# Patient Record
Sex: Female | Born: 1940 | Race: Black or African American | Hispanic: No | Marital: Single | Smoking: Never smoker
Health system: Southern US, Community
[De-identification: ages and names within clinical notes are randomized; demographics above are authoritative.]

## PROBLEM LIST (undated history)

## (undated) DIAGNOSIS — M1990 Unspecified osteoarthritis, unspecified site: Secondary | ICD-10-CM

## (undated) DIAGNOSIS — E7800 Pure hypercholesterolemia, unspecified: Secondary | ICD-10-CM

---

## 2003-03-27 ENCOUNTER — Encounter: Payer: Self-pay | Admitting: Internal Medicine

## 2003-03-27 ENCOUNTER — Encounter: Admission: RE | Admit: 2003-03-27 | Discharge: 2003-03-27 | Payer: Self-pay | Admitting: Internal Medicine

## 2003-11-21 ENCOUNTER — Encounter: Admission: RE | Admit: 2003-11-21 | Discharge: 2003-11-21 | Payer: Self-pay | Admitting: Internal Medicine

## 2004-04-08 ENCOUNTER — Encounter: Admission: RE | Admit: 2004-04-08 | Discharge: 2004-04-08 | Payer: Self-pay | Admitting: Internal Medicine

## 2005-03-14 IMAGING — CR DG WRIST COMPLETE 3+V*L*
3 series · 3 of 3 positions shown · non-contrast
Comparison: none

CLINICAL DATA: Left wrist pain, no injury.
 LEFT WRIST
 Three views of the left wrist were obtained.  No acute fracture is seen. The bones are osteopenic.  The carpal bones are in normal position.
 IMPRESSION
 No acute abnormality.  Osteopenia.

[view not recorded (1 of 3)]
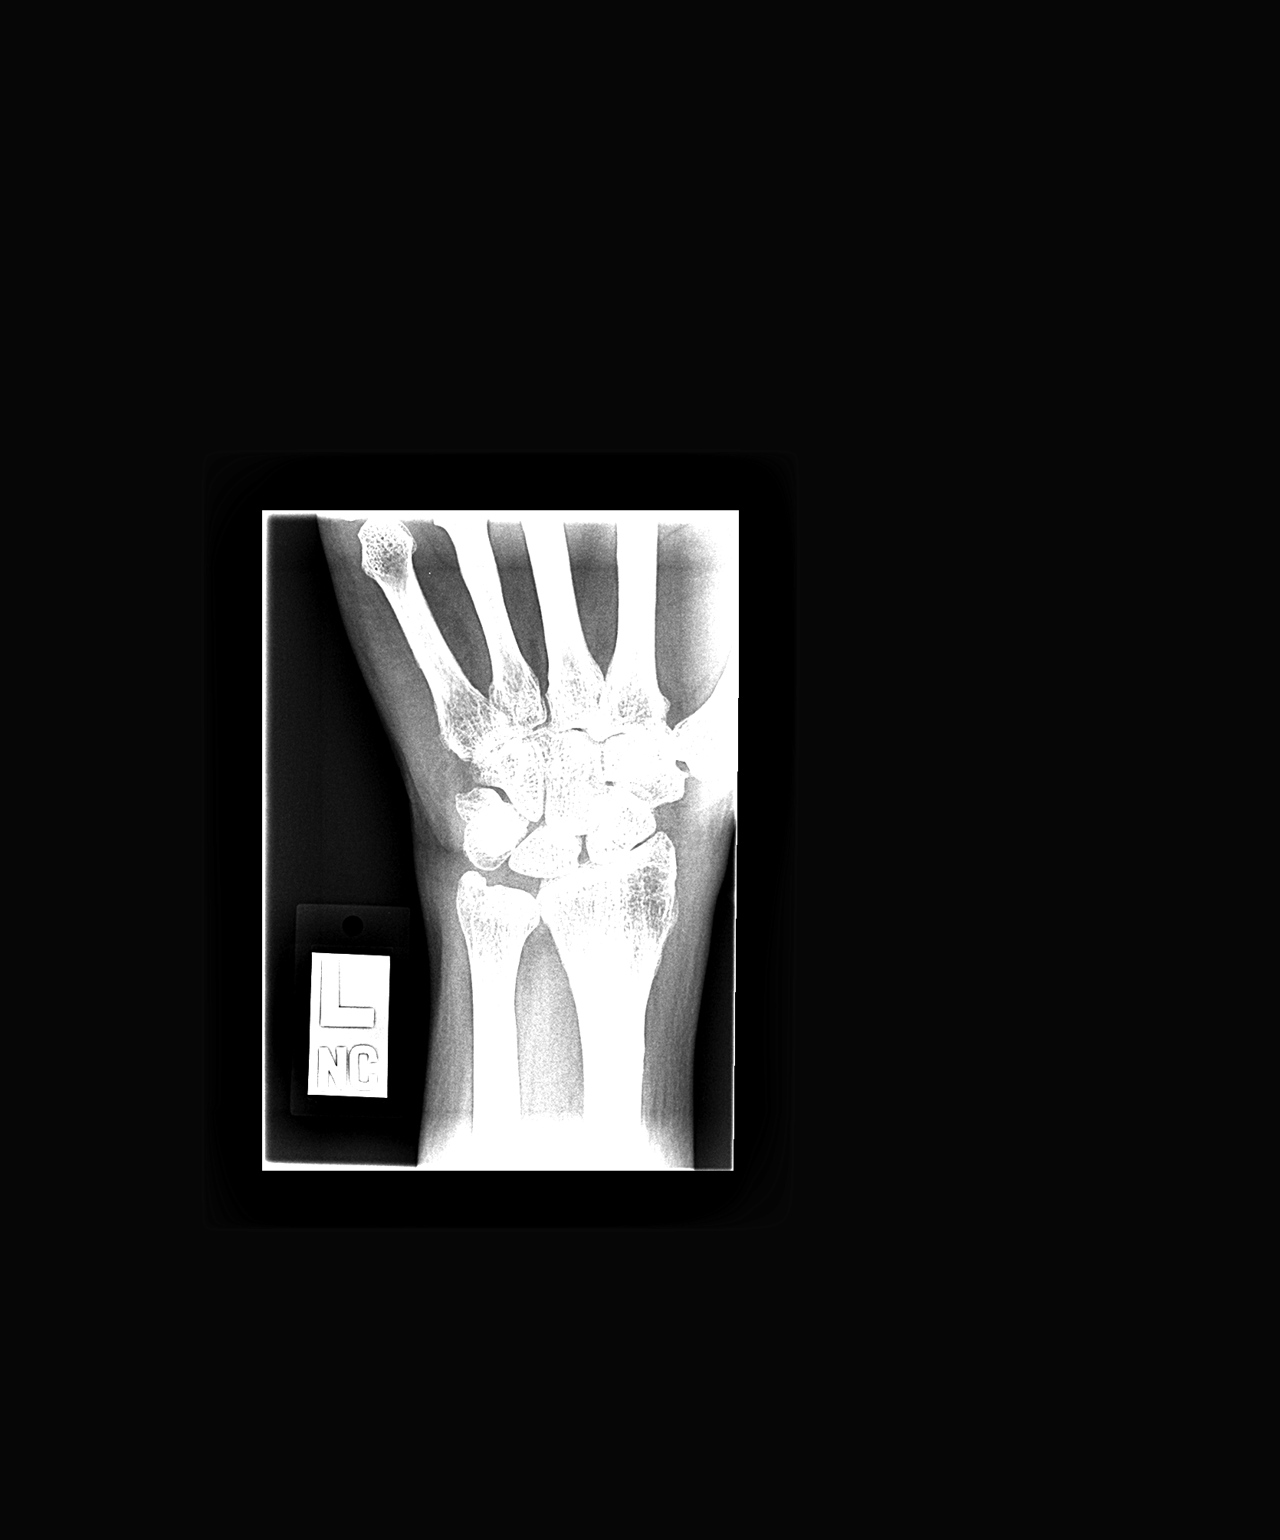

[view not recorded (2 of 3)]
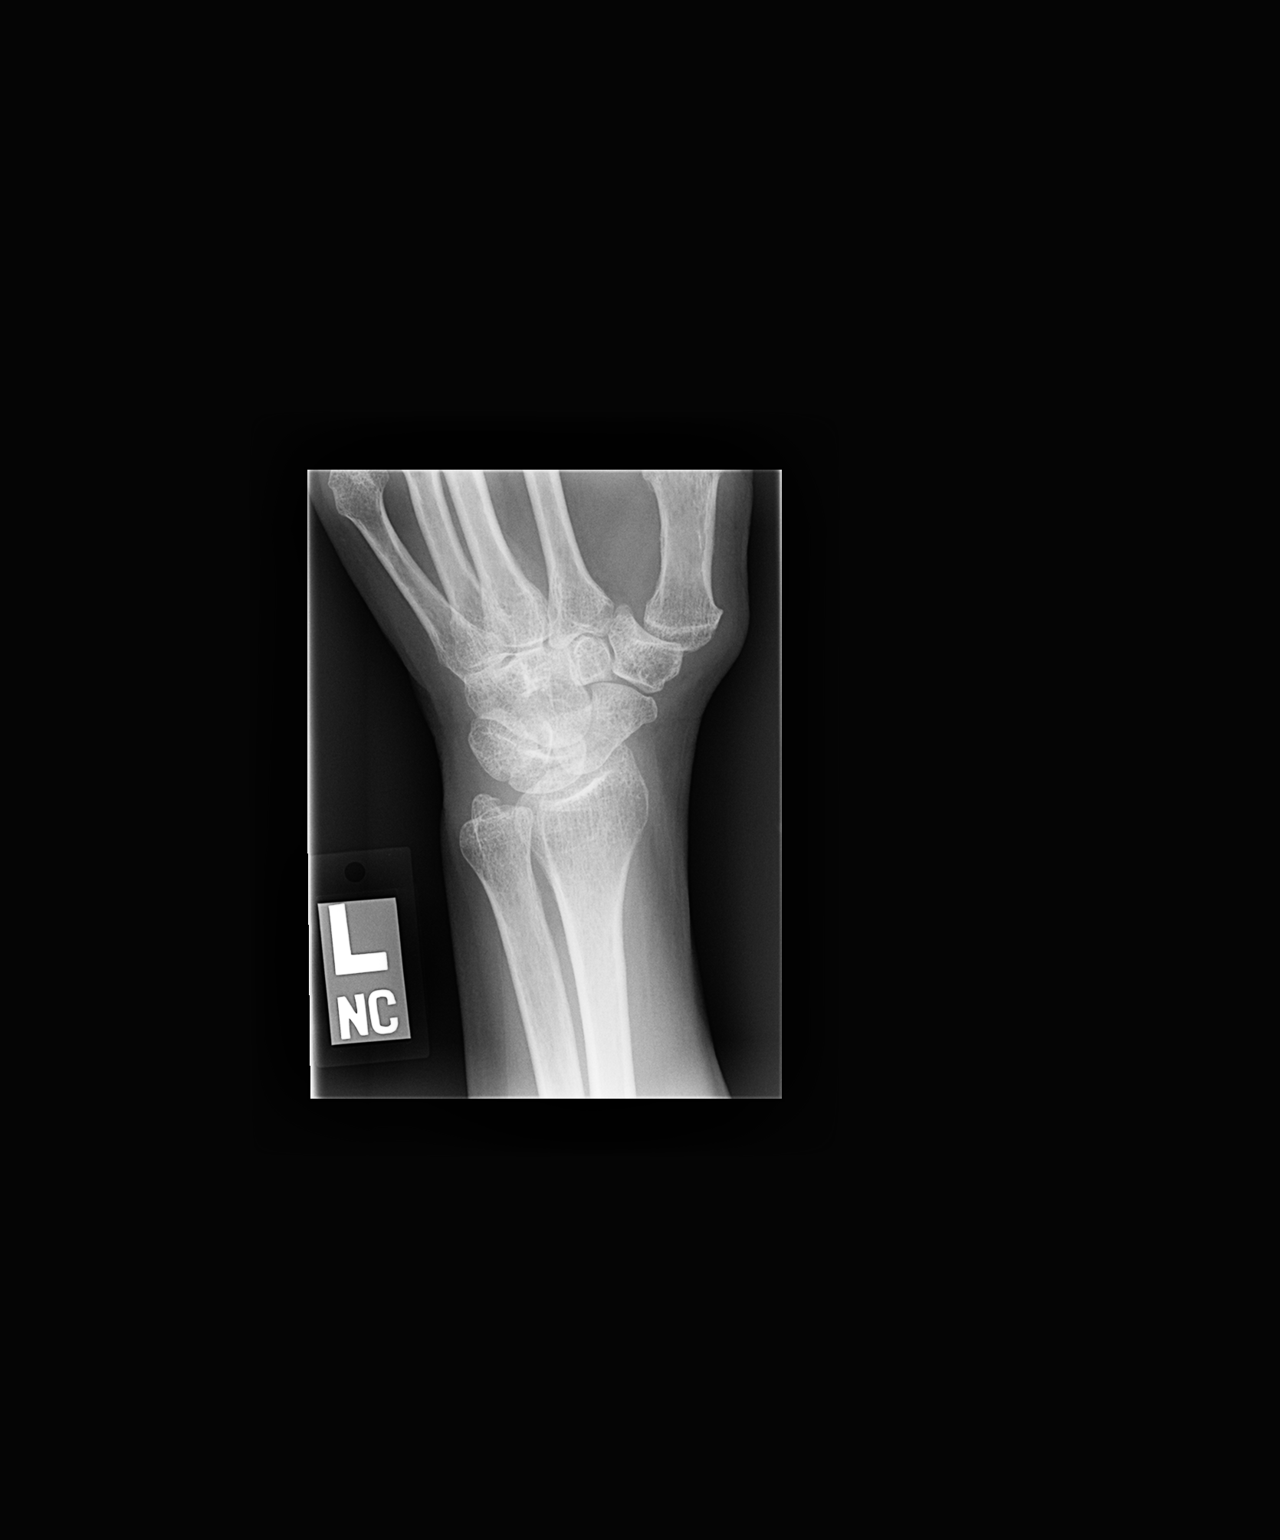

[view not recorded (3 of 3)]
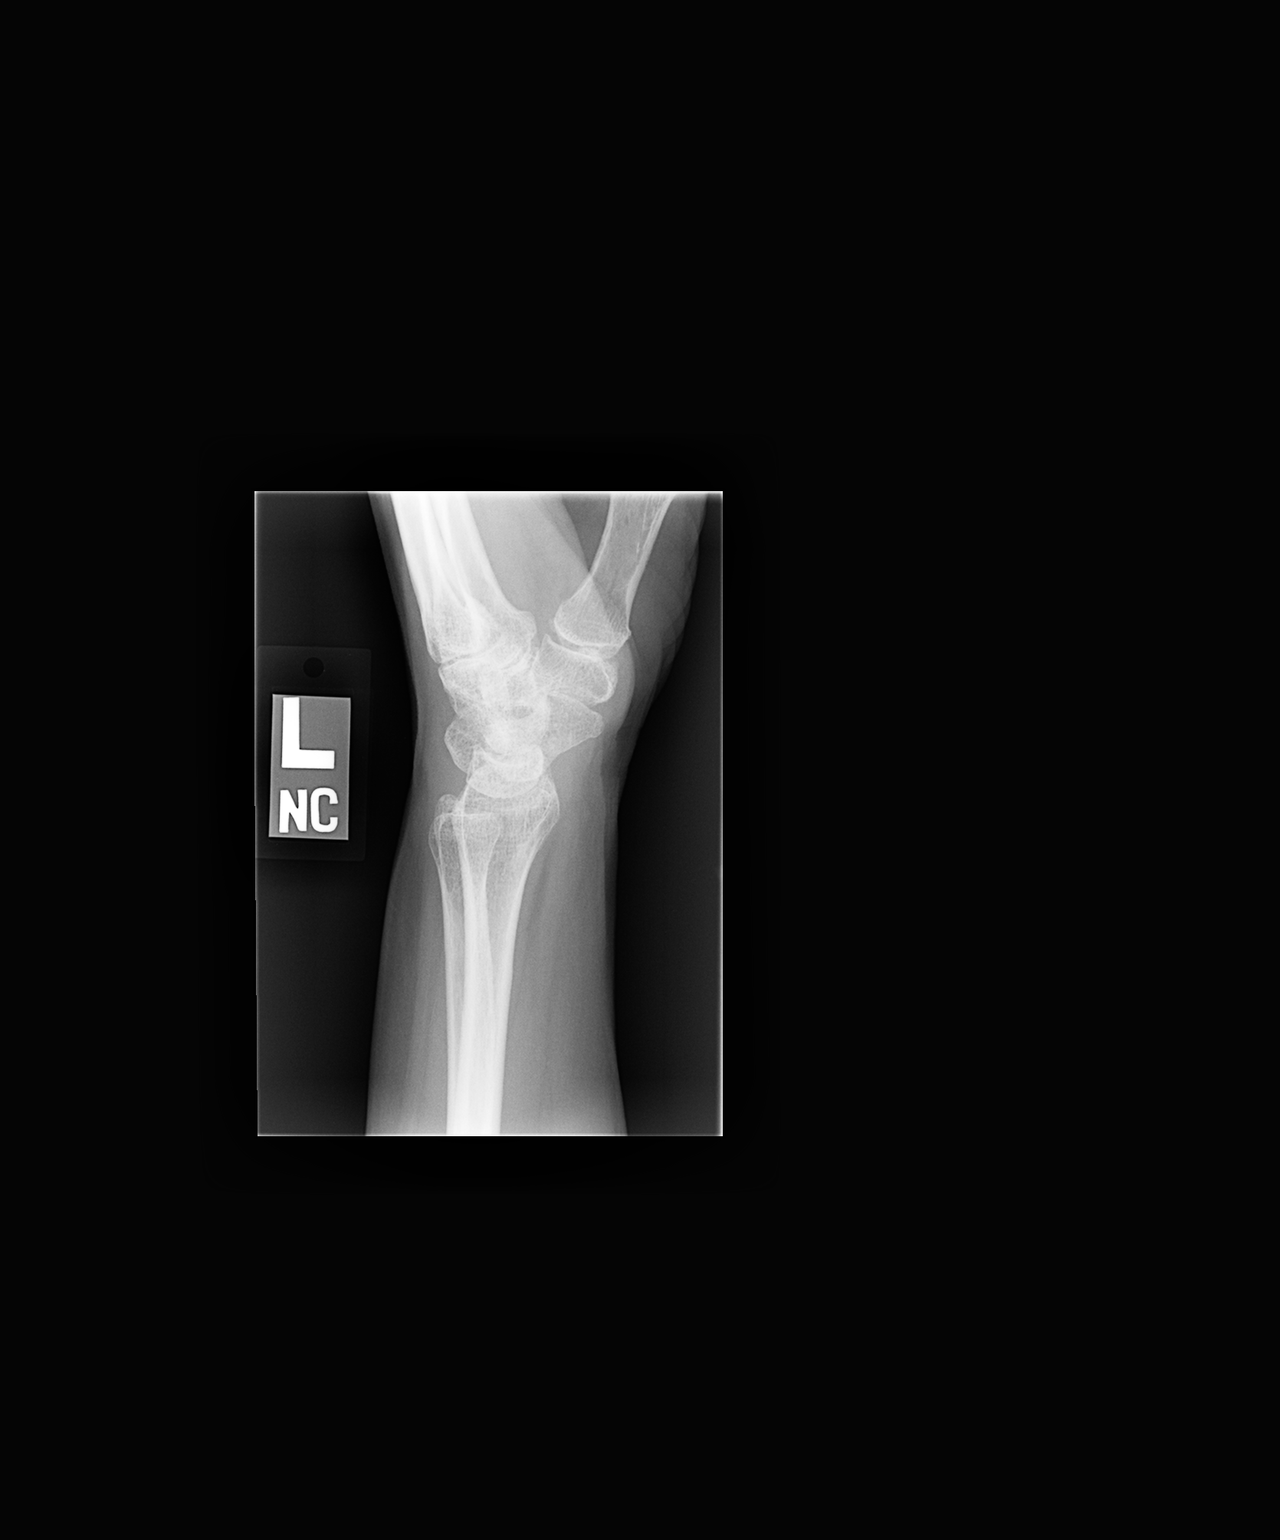

[3 of 3 positions shown; findings below may reference images not displayed]

## 2006-04-15 ENCOUNTER — Encounter: Admission: RE | Admit: 2006-04-15 | Discharge: 2006-04-15 | Payer: Self-pay | Admitting: Internal Medicine

## 2007-07-11 ENCOUNTER — Encounter: Admission: RE | Admit: 2007-07-11 | Discharge: 2007-07-11 | Payer: Self-pay | Admitting: Internal Medicine

## 2008-07-11 ENCOUNTER — Encounter: Admission: RE | Admit: 2008-07-11 | Discharge: 2008-07-11 | Payer: Self-pay | Admitting: Internal Medicine

## 2009-07-14 ENCOUNTER — Encounter: Admission: RE | Admit: 2009-07-14 | Discharge: 2009-07-14 | Payer: Self-pay | Admitting: Internal Medicine

## 2010-08-14 ENCOUNTER — Encounter: Admission: RE | Admit: 2010-08-14 | Discharge: 2010-08-14 | Payer: Self-pay | Admitting: Internal Medicine

## 2011-07-13 ENCOUNTER — Other Ambulatory Visit: Payer: Self-pay | Admitting: Internal Medicine

## 2011-07-13 DIAGNOSIS — M810 Age-related osteoporosis without current pathological fracture: Secondary | ICD-10-CM

## 2011-07-28 ENCOUNTER — Ambulatory Visit
Admission: RE | Admit: 2011-07-28 | Discharge: 2011-07-28 | Disposition: A | Discharge status: Home / Self Care | Payer: Medicare Other | Source: Ambulatory Visit | Attending: Internal Medicine | Admitting: Internal Medicine

## 2011-07-28 DIAGNOSIS — M810 Age-related osteoporosis without current pathological fracture: Secondary | ICD-10-CM

## 2011-08-30 ENCOUNTER — Other Ambulatory Visit: Payer: Self-pay | Admitting: Internal Medicine

## 2011-08-30 DIAGNOSIS — Z1231 Encounter for screening mammogram for malignant neoplasm of breast: Secondary | ICD-10-CM

## 2011-09-21 ENCOUNTER — Ambulatory Visit: Payer: Medicare Other

## 2011-09-28 ENCOUNTER — Ambulatory Visit
Admission: RE | Admit: 2011-09-28 | Discharge: 2011-09-28 | Disposition: A | Discharge status: Home / Self Care | Payer: Medicare Other | Source: Ambulatory Visit | Attending: Internal Medicine | Admitting: Internal Medicine

## 2011-09-28 DIAGNOSIS — Z1231 Encounter for screening mammogram for malignant neoplasm of breast: Secondary | ICD-10-CM

## 2012-08-29 ENCOUNTER — Other Ambulatory Visit: Payer: Self-pay | Admitting: Internal Medicine

## 2012-08-29 DIAGNOSIS — Z1231 Encounter for screening mammogram for malignant neoplasm of breast: Secondary | ICD-10-CM

## 2012-10-02 ENCOUNTER — Ambulatory Visit
Admission: RE | Admit: 2012-10-02 | Discharge: 2012-10-02 | Disposition: A | Discharge status: Home / Self Care | Payer: Medicare Other | Source: Ambulatory Visit | Attending: Internal Medicine | Admitting: Internal Medicine

## 2012-10-02 DIAGNOSIS — Z1231 Encounter for screening mammogram for malignant neoplasm of breast: Secondary | ICD-10-CM

## 2013-05-22 ENCOUNTER — Ambulatory Visit
Admission: RE | Admit: 2013-05-22 | Discharge: 2013-05-22 | Disposition: A | Discharge status: Home / Self Care | Payer: Self-pay | Source: Ambulatory Visit | Attending: Internal Medicine | Admitting: Internal Medicine

## 2013-05-22 ENCOUNTER — Other Ambulatory Visit: Payer: Self-pay | Admitting: Internal Medicine

## 2013-05-22 DIAGNOSIS — M25561 Pain in right knee: Secondary | ICD-10-CM

## 2013-09-17 ENCOUNTER — Other Ambulatory Visit: Payer: Self-pay

## 2013-09-17 DIAGNOSIS — Z1231 Encounter for screening mammogram for malignant neoplasm of breast: Secondary | ICD-10-CM

## 2013-10-09 ENCOUNTER — Ambulatory Visit
Admission: RE | Admit: 2013-10-09 | Discharge: 2013-10-09 | Disposition: A | Discharge status: Home / Self Care | Payer: Medicare HMO | Source: Ambulatory Visit

## 2013-10-09 DIAGNOSIS — Z1231 Encounter for screening mammogram for malignant neoplasm of breast: Secondary | ICD-10-CM

## 2013-12-25 ENCOUNTER — Encounter (HOSPITAL_COMMUNITY): Payer: Self-pay | Admitting: Emergency Medicine

## 2013-12-25 ENCOUNTER — Emergency Department (HOSPITAL_COMMUNITY)
Admission: EM | Admit: 2013-12-25 | Discharge: 2013-12-25 | Disposition: A | Discharge status: Home / Self Care | Payer: Medicare HMO | Attending: Emergency Medicine | Admitting: Emergency Medicine

## 2013-12-25 ENCOUNTER — Emergency Department (HOSPITAL_COMMUNITY): Payer: Medicare HMO

## 2013-12-25 DIAGNOSIS — Z8739 Personal history of other diseases of the musculoskeletal system and connective tissue: Secondary | ICD-10-CM | POA: Insufficient documentation

## 2013-12-25 DIAGNOSIS — E7800 Pure hypercholesterolemia, unspecified: Secondary | ICD-10-CM | POA: Insufficient documentation

## 2013-12-25 DIAGNOSIS — M25569 Pain in unspecified knee: Secondary | ICD-10-CM | POA: Insufficient documentation

## 2013-12-25 DIAGNOSIS — M25469 Effusion, unspecified knee: Secondary | ICD-10-CM | POA: Insufficient documentation

## 2013-12-25 HISTORY — DX: Unspecified osteoarthritis, unspecified site: M19.90

## 2013-12-25 HISTORY — DX: Pure hypercholesterolemia, unspecified: E78.00

## 2013-12-25 MED ORDER — HYDROCODONE-ACETAMINOPHEN 5-325 MG PO TABS: 1.0000 | ORAL_TABLET | Freq: Four times a day (QID) | ORAL | Status: AC | PRN

## 2013-12-25 NOTE — ED Provider Notes (Signed)
CSN: [id number]     Arrival date & time 12/25/13  1800 History  This chart was scribed for non-physician practitioner, Roxy Horsemanobert Gionni Vaca, PA-C,working with Juliet RudeNathan R. Rubin PayorPickering, MD, by Karle PlumberJennifer Tensley, ED Scribe.  This patient was seen in room TR09C/TR09C and the patient's care was started at 6:40 PM.  Chief Complaint  Patient presents with  . Knee Pain   The history is provided by the patient. No language interpreter was used.   HPI Comments:  Grace Mcgee is a 73 y.o. female with h/o arthritis who presents to the Emergency Department complaining of moderate left knee pain that started earlier today. She states she was walking and her left knee gave out on her, but states she did not fall to the ground because someone was there to catch her. She states the pain is mild while sitting but states it is much worse when she tries to walk. She reports the pain radiates from her left knee down her shin and up into her left thigh. She reports posterior knee pain with elevation of the leg. She denies LOC, weakness, or numbness of the extremity. She denies any recent surgeries. She denies taking anticoagulants, but states she takes ASA daily.   Past Medical History  Diagnosis Date  . Arthritis   . High cholesterol    History reviewed. No pertinent past surgical history. History reviewed. No pertinent family history. History  Substance Use Topics  . Smoking status: Never Smoker   . Smokeless tobacco: Not on file  . Alcohol Use: No   OB History   Grav Para Term Preterm Abortions TAB SAB Ect Mult Living                 Review of Systems  Musculoskeletal: Positive for arthralgias (left knee) and joint swelling (left knee).  Neurological: Negative for syncope, weakness and numbness.    Allergies  Review of patient's allergies indicates no known allergies.  Home Medications   Prior to Admission medications   Not on File   Triage Vitals: BP 134/80  Pulse 88  Temp(Src) 98.7 F (37.1 C)  (Oral)  Resp 18  SpO2 96% Physical Exam  Nursing note and vitals reviewed. Constitutional: She is oriented to person, place, and time. She appears well-developed and well-nourished.  HENT:  Head: Normocephalic and atraumatic.  Eyes: EOM are normal.  Neck: Normal range of motion.  Cardiovascular: Normal rate.   Intact distal pulses.  Pulmonary/Chest: Effort normal.  Musculoskeletal: Normal range of motion.  Left knee moderately swollen and tender to palpation, but no bony abnormalities or deformity. ROM and strength limited to pain. No unilateral leg swelling, no evidence of DVT, or septic joint.   Neurological: She is alert and oriented to person, place, and time.  Skin: Skin is warm and dry.  Psychiatric: She has a normal mood and affect. Her behavior is normal.    ED Course  Procedures (including critical care time) DIAGNOSTIC STUDIES: Oxygen Saturation is 96% on RA, normal by my interpretation.   COORDINATION OF CARE: 6:43 PM- Will X-Ray left knee. Pt verbalizes understanding and agrees to plan.  Medications - No data to display  Labs Review Labs Reviewed - No data to display  Imaging Review Dg Knee Complete 4 Views Left  12/25/2013   CLINICAL DATA:  Left knee pain.  EXAM: LEFT KNEE - COMPLETE 4+ VIEW  COMPARISON:  None.  FINDINGS: No fracture, dislocation or joint effusion is identified. Mild degenerative changes are present, predominantly involving the  patellofemoral joint. No bony lesions or destruction identified. Soft tissues are unremarkable.  IMPRESSION: No acute fracture. 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   Electronically Signed   By: Irish LackGlenn  Yamagata M.D.   On: 12/25/2013 20:10     EKG Interpretation None      MDM   Final diagnoses:  Knee pain    Patient with right knee pain. She does have some degenerative changes on x-ray. There is moderate swelling, I will give the patient a knee sleeve, and recommend  orthopedic/PCP followup. The patient a small dose pain medicine. She understands and agrees with the plan. She is stable and ready for discharge.  I personally performed the services described in this documentation, which was scribed in my presence. The recorded information has been reviewed and is accurate.    Roxy Horsemanobert Kathryne Ramella, PA-C 12/26/13 0120

## 2013-12-25 NOTE — Discharge Instructions (Signed)
Arthralgia °Your caregiver has diagnosed you as suffering from an arthralgia. Arthralgia means there is pain in a joint. This can come from many reasons including: °· Bruising the joint which causes soreness (inflammation) in the joint. °· Wear and tear on the joints which occur as we grow older (osteoarthritis). °· Overusing the joint. °· Various forms of arthritis. °· Infections of the joint. °Regardless of the cause of pain in your joint, most of these different pains respond to anti-inflammatory drugs and rest. The exception to this is when a joint is infected, and these cases are treated with antibiotics, if it is a bacterial infection. °HOME CARE INSTRUCTIONS  °· Rest the injured area for as long as directed by your caregiver. Then slowly start using the joint as directed by your caregiver and as the pain allows. Crutches as directed may be useful if the ankles, knees or hips are involved. If the knee was splinted or casted, continue use and care as directed. If an stretchy or elastic wrapping bandage has been applied today, it should be removed and re-applied every 3 to 4 hours. It should not be applied tightly, but firmly enough to keep swelling down. Watch toes and feet for swelling, bluish discoloration, coldness, numbness or excessive pain. If any of these problems (symptoms) occur, remove the ace bandage and re-apply more loosely. If these symptoms persist, contact your caregiver or return to this location. °· For the first 24 hours, keep the injured extremity elevated on pillows while lying down. °· Apply ice for 15-20 minutes to the sore joint every couple hours while awake for the first half day. Then 03-04 times per day for the first 48 hours. Put the ice in a plastic bag and place a towel between the bag of ice and your skin. °· Wear any splinting, casting, elastic bandage applications, or slings as instructed. °· Only take over-the-counter or prescription medicines for pain, discomfort, or fever as  directed by your caregiver. Do not use aspirin immediately after the injury unless instructed by your physician. Aspirin can cause increased bleeding and bruising of the tissues. °· If you were given crutches, continue to use them as instructed and do not resume weight bearing on the sore joint until instructed. °Persistent pain and inability to use the sore joint as directed for more than 2 to 3 days are warning signs indicating that you should see a caregiver for a follow-up visit as soon as possible. Initially, a hairline fracture (break in bone) may not be evident on X-rays. Persistent pain and swelling indicate that further evaluation, non-weight bearing or use of the joint (use of crutches or slings as instructed), or further X-rays are indicated. X-rays may sometimes not show a small fracture until a week or 10 days later. Make a follow-up appointment with your own caregiver or one to whom we have referred you. A radiologist (specialist in reading X-rays) may read your X-rays. Make sure you know how you are to obtain your X-ray results. Do not assume everything is normal if you do not hear from us. °SEEK MEDICAL CARE IF: °Bruising, swelling, or pain increases. °SEEK IMMEDIATE MEDICAL CARE IF:  °· Your fingers or toes are numb or blue. °· The pain is not responding to medications and continues to stay the same or get worse. °· The pain in your joint becomes severe. °· You develop a fever over 102° F (38.9° C). °· It becomes impossible to move or use the joint. °MAKE SURE YOU:  °·   Understand these instructions. °· Will watch your condition. °· Will get help right away if you are not doing well or get worse. °Document Released: 08/30/2005 Document Revised: 11/22/2011 Document Reviewed: 04/17/2008 °ExitCare® Patient Information ©2014 [employer]. ° °Cryotherapy °Cryotherapy means treatment with cold. Ice or gel packs can be used to reduce both pain and swelling. Ice is the most helpful within the first 24 to 48  hours after an injury or flareup from overusing a muscle or joint. Sprains, strains, spasms, burning pain, shooting pain, and aches can all be eased with ice. Ice can also be used when recovering from surgery. Ice is effective, has very few side effects, and is safe for most people to use. °PRECAUTIONS  °Ice is not a safe treatment option for people with: °· Raynaud's phenomenon. This is a condition affecting small blood vessels in the extremities. Exposure to cold may cause your problems to return. °· Cold hypersensitivity. There are many forms of cold hypersensitivity, including: °· Cold urticaria. Red, itchy hives appear on the skin when the tissues begin to warm after being iced. °· Cold erythema. This is a red, itchy rash caused by exposure to cold. °· Cold hemoglobinuria. Red blood cells break down when the tissues begin to warm after being iced. The hemoglobin that carry oxygen are passed into the urine because they cannot combine with blood proteins fast enough. °· Numbness or altered sensitivity in the area being iced. °If you have any of the following conditions, do not use ice until you have discussed cryotherapy with your caregiver: °· Heart conditions, such as arrhythmia, angina, or chronic heart disease. °· High blood pressure. °· Healing wounds or open skin in the area being iced. °· Current infections. °· Rheumatoid arthritis. °· Poor circulation. °· Diabetes. °Ice slows the blood flow in the region it is applied. This is beneficial when trying to stop inflamed tissues from spreading irritating chemicals to surrounding tissues. However, if you expose your skin to cold temperatures for too long or without the proper protection, you can damage your skin or nerves. Watch for signs of skin damage due to cold. °HOME CARE INSTRUCTIONS °Follow these tips to use ice and cold packs safely. °· Place a dry or damp towel between the ice and skin. A damp towel will cool the skin more quickly, so you may need to  shorten the time that the ice is used. °· For a more rapid response, add gentle compression to the ice. °· Ice for no more than 10 to 20 minutes at a time. The bonier the area you are icing, the less time it will take to get the benefits of ice. °· Check your skin after 5 minutes to make sure there are no signs of a poor response to cold or skin damage. °· Rest 20 minutes or more in between uses. °· Once your skin is numb, you can end your treatment. You can test numbness by very lightly touching your skin. The touch should be so light that you do not see the skin dimple from the pressure of your fingertip. When using ice, most people will feel these normal sensations in this order: cold, burning, aching, and numbness. °· Do not use ice on someone who cannot communicate their responses to pain, such as small children or people with dementia. °HOW TO MAKE AN ICE PACK °Ice packs are the most common way to use ice therapy. Other methods include ice massage, ice baths, and cryo-sprays. Muscle creams that cause a   cold, tingly feeling do not offer the same benefits that ice offers and should not be used as a substitute unless recommended by your caregiver. °To make an ice pack, do one of the following: °· Place crushed ice or a bag of frozen vegetables in a sealable plastic bag. Squeeze out the excess air. Place this bag inside another plastic bag. Slide the bag into a pillowcase or place a damp towel between your skin and the bag. °· Mix 3 parts water with 1 part rubbing alcohol. Freeze the mixture in a sealable plastic bag. When you remove the mixture from the freezer, it will be slushy. Squeeze out the excess air. Place this bag inside another plastic bag. Slide the bag into a pillowcase or place a damp towel between your skin and the bag. °SEEK MEDICAL CARE IF: °· You develop white spots on your skin. This may give the skin a blotchy (mottled) appearance. °· Your skin turns blue or pale. °· Your skin becomes waxy or  hard. °· Your swelling gets worse. °MAKE SURE YOU:  °· Understand these instructions. °· Will watch your condition. °· Will get help right away if you are not doing well or get worse. °Document Released: 04/26/2011 Document Revised: 11/22/2011 Document Reviewed: 04/26/2011 °ExitCare® Patient Information ©2014 [employer]. ° °

## 2013-12-25 NOTE — ED Notes (Signed)
Pt reports having knee pain and was walking "her left knee just gave out on her." now having pain and difficulty bearing weight.

## 2013-12-28 NOTE — ED Provider Notes (Signed)
Medical screening examination/treatment/procedure(s) were performed by non-physician practitioner and as supervising physician I was immediately available for consultation/collaboration.   EKG Interpretation None       Gennell How R. Nora Rooke, MD 12/28/13 1102 

## 2020-05-18 ENCOUNTER — Other Ambulatory Visit: Payer: Self-pay

## 2020-05-18 ENCOUNTER — Ambulatory Visit (HOSPITAL_COMMUNITY)
Admission: EM | Admit: 2020-05-18 | Discharge: 2020-05-18 | Disposition: A | Discharge status: Home / Self Care | Payer: Medicare Other | Attending: Family Medicine | Admitting: Family Medicine

## 2020-05-18 DIAGNOSIS — U071 COVID-19: Secondary | ICD-10-CM | POA: Insufficient documentation

## 2020-05-18 DIAGNOSIS — Z20822 Contact with and (suspected) exposure to covid-19: Secondary | ICD-10-CM | POA: Diagnosis not present

## 2020-05-18 NOTE — ED Triage Notes (Signed)
covid exposure. No symptoms

## 2020-05-19 LAB — SARS CORONAVIRUS 2 (TAT 6-24 HRS): SARS Coronavirus 2: POSITIVE — AB

## 2020-05-20 ENCOUNTER — Telehealth: Payer: Self-pay | Admitting: Infectious Diseases

## 2020-05-20 NOTE — Telephone Encounter (Signed)
Called to Discuss with patient about Covid symptoms and the use of the monoclonal antibody infusion for those with mild to moderate Covid symptoms and at a high risk of hospitalization.     Pt appears to qualify for this infusion due to co-morbid conditions and/or a member of an at-risk group in accordance with the FDA Emergency Use Authorization.    She feels very good and overall has very little to no symptoms. She coughs every now and then but overall very good. No other symptoms. She has no altered taste or smell.  She had the Pfizer vaccine with last dose in February 2021.  She would like to monitor how she is doing over the next 48 hours   I gave her our clinic information to schedule [phone].    Rexene Alberts, MSN, NP-C [facility]  [email or web address] Pager: [phone] Office: [phone] [facility] Main Line: [phone]

## 2020-06-03 ENCOUNTER — Ambulatory Visit (HOSPITAL_COMMUNITY)
Admission: EM | Admit: 2020-06-03 | Discharge: 2020-06-03 | Disposition: A | Discharge status: Home / Self Care | Payer: Medicare Other

## 2020-06-03 ENCOUNTER — Other Ambulatory Visit: Payer: Self-pay

## 2020-06-03 NOTE — ED Triage Notes (Signed)
Patient presents to [facility] for retest within 3 months of her previous positive (9/5).  Patient made aware of clinical guidelines for retesting and will d/c.

## 2020-06-06 ENCOUNTER — Other Ambulatory Visit: Payer: Medicare Other

## 2020-06-06 DIAGNOSIS — Z20822 Contact with and (suspected) exposure to covid-19: Secondary | ICD-10-CM

## 2020-06-08 LAB — NOVEL CORONAVIRUS, NAA: SARS-CoV-2, NAA: NOT DETECTED

## 2020-06-08 LAB — SARS-COV-2, NAA 2 DAY TAT

## 2022-06-02 ENCOUNTER — Encounter (HOSPITAL_COMMUNITY): Payer: Self-pay

## 2022-06-02 ENCOUNTER — Ambulatory Visit (HOSPITAL_COMMUNITY): Payer: Medicare Other

## 2022-06-02 ENCOUNTER — Ambulatory Visit (HOSPITAL_COMMUNITY)
Admission: EM | Admit: 2022-06-02 | Discharge: 2022-06-02 | Disposition: A | Discharge status: Home / Self Care | Payer: Medicare Other | Attending: Family Medicine | Admitting: Family Medicine

## 2022-06-02 DIAGNOSIS — Z20822 Contact with and (suspected) exposure to covid-19: Secondary | ICD-10-CM | POA: Insufficient documentation

## 2022-06-02 NOTE — ED Triage Notes (Signed)
Pt was exposed to covid  x3 days ago  . Pt states she is not having any symptoms

## 2022-06-02 NOTE — Discharge Instructions (Signed)
You have been tested for COVID-19 today. °If your test returns positive, you will receive a phone call from  regarding your results. °Negative test results are not called. °Both positive and negative results area always visible on MyChart. °If you do not have a MyChart account, sign up instructions are provided in your discharge papers. °Please do not hesitate to contact us should you have questions or concerns. ° °

## 2022-06-02 NOTE — ED Provider Notes (Signed)
  [place]   [id number] 06/02/22 Arrival Time: 5681  ASSESSMENT & PLAN:  1. Exposure to COVID-19 virus    Discussed typical duration of viral illnesses. Viral testing sent. OTC symptom care as needed.  Pending: Labs Reviewed  SARS CORONAVIRUS 2 (TAT 6-24 HRS)     Follow-up Information     Lorene Dy, MD.   Specialty: Internal Medicine Why: As needed. Contact information: [place], [place] [address] [phone]                 Reviewed expectations re: course of current medical issues. Questions answered. Outlined signs and symptoms indicating need for more acute intervention. Understanding verbalized. After Visit Summary given.   SUBJECTIVE: History from: Patient. Grace Mcgee is a 81 y.o. female. Reports: COVID exp  d ago. Denies: runny nose, congestion, fever, cough, sore throat, difficulty breathing, and headache. Normal PO intake without n/v/d.  OBJECTIVE:  Vitals:   06/02/22 1734  Weight: 74.4 kg    General appearance: alert; no distress Eyes: PERRLA; EOMI; conjunctiva normal HENT: [place]; AT; without nasal congestion Neck: supple  Lungs: speaks full sentences without difficulty; unlabored Extremities: no edema Skin: warm and dry Neurologic: normal gait Psychological: alert and cooperative; normal mood and affect  Labs:  Labs Reviewed  SARS CORONAVIRUS 2 (TAT 6-24 HRS)    No Known Allergies  Past Medical History:  Diagnosis Date   Arthritis    High cholesterol    Social History   Socioeconomic History   Marital status: Single    Spouse name: Not on file   Number of children: Not on file   Years of education: Not on file   Highest education level: Not on file  Occupational History   Not on file  Tobacco Use   Smoking status: Never   Smokeless tobacco: Not on file  Substance and Sexual Activity   Alcohol use: No   Drug use: No   Sexual activity: Not on file  Other Topics Concern   Not on  file  Social History Narrative   Not on file   Social Determinants of Health   Financial Resource Strain: Not on file  Food Insecurity: Not on file  Transportation Needs: Not on file  Physical Activity: Not on file  Stress: Not on file  Social Connections: Not on file  Intimate Partner Violence: Not on file   History reviewed. No pertinent family history. History reviewed. No pertinent surgical history.   Vanessa Kick, MD 06/02/22 [phone]

## 2022-06-03 LAB — SARS CORONAVIRUS 2 (TAT 6-24 HRS): SARS Coronavirus 2: NEGATIVE

## 2023-10-12 ENCOUNTER — Other Ambulatory Visit: Payer: Self-pay | Admitting: Internal Medicine

## 2023-10-12 ENCOUNTER — Ambulatory Visit
Admission: RE | Admit: 2023-10-12 | Discharge: 2023-10-12 | Disposition: A | Discharge status: Home / Self Care | Payer: Medicare Other | Source: Ambulatory Visit | Attending: Internal Medicine | Admitting: Internal Medicine

## 2023-10-12 DIAGNOSIS — M25572 Pain in left ankle and joints of left foot: Secondary | ICD-10-CM

## 2024-09-25 ENCOUNTER — Encounter (HOSPITAL_COMMUNITY): Payer: Self-pay | Admitting: Emergency Medicine

## 2024-09-25 ENCOUNTER — Ambulatory Visit (HOSPITAL_COMMUNITY)
Admission: EM | Admit: 2024-09-25 | Discharge: 2024-09-25 | Disposition: A | Discharge status: Home / Self Care | Attending: Emergency Medicine | Admitting: Emergency Medicine

## 2024-09-25 ENCOUNTER — Ambulatory Visit (INDEPENDENT_AMBULATORY_CARE_PROVIDER_SITE_OTHER)

## 2024-09-25 DIAGNOSIS — M79672 Pain in left foot: Secondary | ICD-10-CM

## 2024-09-25 MED ORDER — DICLOFENAC SODIUM 1 % EX GEL: 2.0000 g | Freq: Four times a day (QID) | CUTANEOUS | 0 refills | Status: AC

## 2024-09-25 NOTE — ED Provider Notes (Signed)
 " [facility]    CSN: [id number] Arrival date & time: 09/25/24  1021      History   Chief Complaint Chief Complaint  Patient presents with   Foot Pain    HPI Grace Mcgee is a 84 y.o. female.   Patient presents to clinic over concern of right foot pain that started yesterday.   Patient goes to the [facility] and walks Mon-Thurs and yesterday when walking she had pain in the right foot. Did not have trauma, injuries or falls. Wears athletic shoes to walk. Noticed some swelling and pain to the 1st metatarsal. Took 81 mg Aspirin yesterday and rubbed some cream on it, also soaked in epsom salt.   Hx of osteopenia. Ambulatory.   The history is provided by the patient and medical records.  Foot Pain    Past Medical History:  Diagnosis Date   Arthritis    High cholesterol     There are no active problems to display for this patient.   History reviewed. No pertinent surgical history.  OB History   No obstetric history on file.      Home Medications    Prior to Admission medications  Medication Sig Start Date End Date Taking? Authorizing Provider  diclofenac  Sodium (VOLTAREN  ARTHRITIS PAIN) 1 % GEL Apply 2 g topically 4 (four) times daily. 09/25/24  Yes Ball, Cortland Crehan  G, FNP  aspirin EC 81 MG tablet Take 81 mg by mouth daily.    [provider]  HYDROcodone -acetaminophen  (NORCO/VICODIN) 5-325 MG per tablet Take 1 tablet by mouth every 6 (six) hours as needed. 12/25/13   Vicky Charleston, PA-C  IRON PO Take 1 tablet by mouth daily.    [provider]  simvastatin (ZOCOR) 40 MG tablet Take 40 mg by mouth daily.    [provider]    Family History History reviewed. No pertinent family history.  Social History Social History[1]   Allergies   Patient has no known allergies.   Review of Systems Review of Systems  Per HPI  Physical Exam Triage Vital Signs ED Triage Vitals  Encounter Vitals Group     BP 09/25/24 1155 138/82      Girls Systolic BP Percentile --      Girls Diastolic BP Percentile --      Boys Systolic BP Percentile --      Boys Diastolic BP Percentile --      Pulse Rate 09/25/24 1155 76     Resp 09/25/24 1155 16     Temp 09/25/24 1155 98.6 F (37 C)     Temp Source 09/25/24 1155 Oral     SpO2 09/25/24 1155 96 %     Weight --      Height --      Head Circumference --      Peak Flow --      Pain Score 09/25/24 1153 7     Pain Loc --      Pain Education --      Exclude from Growth Chart --    No data found.  Updated Vital Signs BP 138/82 (BP Location: Right Arm)   Pulse 76   Temp 98.6 F (37 C) (Oral)   Resp 16   SpO2 96%   Visual Acuity Right Eye Distance:   Left Eye Distance:   Bilateral Distance:    Right Eye Near:   Left Eye Near:    Bilateral Near:     Physical Exam Vitals and nursing note  reviewed.  Constitutional:      Appearance: Normal appearance.  HENT:     Head: Normocephalic and atraumatic.     Right Ear: External ear normal.     Left Ear: External ear normal.     Nose: Nose normal.     Mouth/Throat:     Mouth: Mucous membranes are moist.  Eyes:     Conjunctiva/sclera: Conjunctivae normal.  Cardiovascular:     Rate and Rhythm: Normal rate.     Pulses: Normal pulses.          Dorsalis pedis pulses are 2+ on the left side.       Posterior tibial pulses are 2+ on the left side.  Pulmonary:     Effort: Pulmonary effort is normal. No respiratory distress.  Musculoskeletal:        General: Tenderness present. No signs of injury. Normal range of motion.       Feet:  Feet:     Left foot:     Skin integrity: Skin integrity normal.     Comments: Pain along 1st metatarsal, TTP, mild swelling, brisk capillary refill distally  Skin:    General: Skin is warm and dry.     Capillary Refill: Capillary refill takes less than 2 seconds.  Neurological:     General: No focal deficit present.     Mental Status: She is alert.  Psychiatric:        Mood and Affect:  Mood normal.      UC Treatments / Results  Labs (all labs ordered are listed, but only abnormal results are displayed) Labs Reviewed - No data to display  EKG   Radiology DG Foot Complete Left Result Date: 09/25/2024 EXAM: 3 OR MORE VIEW(S) XRAY OF THE LEFT FOOT 09/25/2024 12:34:58 PM COMPARISON: 1 / 29 / 25 CLINICAL HISTORY: Left foot pain after walking yesterday, no injury. FINDINGS: BONES AND JOINTS: No acute fracture. No malalignment. Degenerative changes about the mid foot. SOFT TISSUES: Unremarkable. IMPRESSION: 1. No acute fracture. Electronically signed by: Norman Gatlin MD MD 09/25/2024 12:38 PM EST RP Workstation: [id number]    Procedures Procedures (including critical care time)  Medications Ordered in UC Medications - No data to display  Initial Impression / Assessment and Plan / UC Course  I have reviewed the triage vital signs and the nursing notes.  Pertinent labs & imaging results that were available during my care of the patient were reviewed by me and considered in my medical decision making (see chart for details).  Vitals and triage reviewed, patient is hemodynamically stable.  Atraumatic left foot pain.  Tenderness along the first metatarsal.  Brisk capillary refill distally and pedal pulses are 2+.  Imaging by my interpretation shows degenerative changes along the first metatarsal, confirmed with radiology overread.  No acute fractures.  Suspect arthritis, symptomatic management discussed.  Plan of care, follow-up care, and return precautions given, no questions at this time.    Final Clinical Impressions(s) / UC Diagnoses   Final diagnoses:  Left foot pain     Discharge Instructions      Use topical Voltaren  gel 4x daily as needed for pain  Stay off the foot until it is feeling better  If symptoms persist follow-up with an orthopedic   X-ray interpretation is listed below ...   CLINICAL HISTORY: Left foot pain after walking yesterday, no  injury.  FINDINGS:  BONES AND JOINTS: No acute fracture. No malalignment. Degenerative changes about the mid foot.  SOFT TISSUES:  Unremarkable.  IMPRESSION: 1. No acute fracture.     ED Prescriptions     Medication Sig Dispense Auth. Provider   diclofenac  Sodium (VOLTAREN  ARTHRITIS PAIN) 1 % GEL Apply 2 g topically 4 (four) times daily. 120 g Ball, Shery [place]  G, FNP      PDMP not reviewed this encounter.     [1]  Social History Tobacco Use   Smoking status: Never  Vaping Use   Vaping status: Never Used  Substance Use Topics   Alcohol use: No   Drug use: No     Mercer, Imre Vecchione  G, FNP 09/25/24 1257  "

## 2024-09-25 NOTE — Medical Student Note (Signed)
 Engineer, Site Note For educational purposes for Medical, PA and NP students only and not part of the legal medical record.   CSN: [id number] Arrival date & time: 09/25/24  1021      History   Chief Complaint Chief Complaint  Patient presents with   Foot Pain    HPI Grace Mcgee is a 84 y.o. female.  Pt has a self reported hx of osteoarthritis, osteoporosis and prediabetes. Yesterday she was walking to the [facility] when she had a gradual onset of L foot pain to the lateral aspect of her foot and ankle. She denies any injury. She noted some initial swelling that is somewhat improved today. She is very active and walks about 2 miles per day. She has not taken any medication for her symptoms.   Foot Pain    Past Medical History:  Diagnosis Date   Arthritis    High cholesterol     There are no active problems to display for this patient.   History reviewed. No pertinent surgical history.  OB History   No obstetric history on file.      Home Medications    Prior to Admission medications  Medication Sig Start Date End Date Taking? Authorizing Provider  aspirin EC 81 MG tablet Take 81 mg by mouth daily.    [provider]  HYDROcodone -acetaminophen  (NORCO/VICODIN) 5-325 MG per tablet Take 1 tablet by mouth every 6 (six) hours as needed. 12/25/13   Vicky Charleston, PA-C  IRON PO Take 1 tablet by mouth daily.    [provider]  simvastatin (ZOCOR) 40 MG tablet Take 40 mg by mouth daily.    [provider]    Family History History reviewed. No pertinent family history.  Social History Social History[1]   Allergies   Patient has no known allergies.   Review of Systems Review of Systems  All other systems reviewed and are negative.    Physical Exam Updated Vital Signs BP 138/82 (BP Location: Right Arm)   Pulse 76   Temp 98.6 F (37 C) (Oral)   Resp 16   SpO2 96%   Physical Exam Vitals and  nursing note reviewed.  Constitutional:      General: She is not in acute distress.    Appearance: She is well-developed.  HENT:     Head: Normocephalic and atraumatic.  Cardiovascular:     Rate and Rhythm: Normal rate and regular rhythm.     Pulses:          Dorsalis pedis pulses are 2+ on the right side and 2+ on the left side.       Posterior tibial pulses are 2+ on the right side and 2+ on the left side.     Heart sounds: No murmur heard. Pulmonary:     Effort: Pulmonary effort is normal. No respiratory distress.     Breath sounds: Normal breath sounds.  Musculoskeletal:     Right ankle: Normal.     Left ankle: Swelling present. Tenderness present over the medial malleolus. Anterior drawer test negative.     Right foot: Normal.     Left foot: Tenderness present.     Comments: Swelling to the medial malleolus with tenderness that extends down to the first and second metatarsals. No ecchymosis or skin changes noted.   Skin:    General: Skin is warm and dry.     Capillary Refill: Capillary refill takes less than 2 seconds.  Neurological:  Mental Status: She is alert.  Psychiatric:        Mood and Affect: Mood normal.      ED Treatments / Results  Labs (all labs ordered are listed, but only abnormal results are displayed) Labs Reviewed - No data to display  EKG  Radiology DG Foot Complete Left Result Date: 09/25/2024 EXAM: 3 OR MORE VIEW(S) XRAY OF THE LEFT FOOT 09/25/2024 12:34:58 PM COMPARISON: 1 / 29 / 25 CLINICAL HISTORY: Left foot pain after walking yesterday, no injury. FINDINGS: BONES AND JOINTS: No acute fracture. No malalignment. Degenerative changes about the mid foot. SOFT TISSUES: Unremarkable. IMPRESSION: 1. No acute fracture. Electronically signed by: Norman Gatlin MD MD 09/25/2024 12:38 PM EST RP Workstation: [id number]    Procedures Procedures (including critical care time)  Medications Ordered in ED Medications - No data to display   Initial  Impression / Assessment and Plan / ED Course  I have reviewed the triage vital signs and the nursing notes.  Pertinent labs & imaging results that were available during my care of the patient were reviewed by me and considered in my medical decision making (see chart for details).    Foot pain  XR of the L foot shows degenerative changes of the mid foot.   Pain is likely from osteoarthritis of the mid foot. Pt counseled on rest until symptoms resolve. Will start Voltaren  gel 4 times daily prn for pain. Follow up with orthopedic if symptoms do not resolve.   Red flag symptoms reviewed and return precautions given.    Final Clinical Impressions(s) / ED Diagnoses   Final diagnoses:  Left foot pain    New Prescriptions New Prescriptions   No medications on file       [1]  Social History Tobacco Use   Smoking status: Never  Vaping Use   Vaping status: Never Used  Substance Use Topics   Alcohol use: No   Drug use: No   "

## 2024-09-25 NOTE — Discharge Instructions (Addendum)
 Use topical Voltaren  gel 4x daily as needed for pain  Stay off the foot until it is feeling better  If symptoms persist follow-up with an orthopedic   X-ray interpretation is listed below ...   CLINICAL HISTORY: Left foot pain after walking yesterday, no injury.  FINDINGS:  BONES AND JOINTS: No acute fracture. No malalignment. Degenerative changes about the mid foot.  SOFT TISSUES: Unremarkable.  IMPRESSION: 1. No acute fracture.

## 2024-09-25 NOTE — ED Triage Notes (Signed)
 Patient was walking at the [facility] yesterday and started having left foot pain.  Patient states it hurts a lot when she walks.  Patient denies any injury to her foot.  Patient soak foot in epsom salt and rub some cream on it.  Patient took a 81mg  Asprin yesterday.
